# Patient Record
Sex: Male | Born: 1975 | Race: White | Hispanic: No | Marital: Married | Smoking: Never smoker
Health system: Southern US, Community
[De-identification: ages and names within clinical notes are randomized; demographics above are authoritative.]

## PROBLEM LIST (undated history)

## (undated) DIAGNOSIS — K219 Gastro-esophageal reflux disease without esophagitis: Secondary | ICD-10-CM

## (undated) DIAGNOSIS — E785 Hyperlipidemia, unspecified: Secondary | ICD-10-CM

## (undated) DIAGNOSIS — Z8661 Personal history of infections of the central nervous system: Secondary | ICD-10-CM

## (undated) HISTORY — DX: Personal history of infections of the central nervous system: Z86.61

## (undated) HISTORY — DX: Gastro-esophageal reflux disease without esophagitis: K21.9

## (undated) HISTORY — DX: Hyperlipidemia, unspecified: E78.5

---
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
## 1998-02-15 HISTORY — PX: KNEE ARTHROSCOPY W/ PARTIAL MEDIAL MENISCECTOMY: SHX1882

## 2004-02-16 HISTORY — PX: VASECTOMY: SHX75

## 2007-10-14 ENCOUNTER — Emergency Department: Payer: Self-pay | Admitting: Emergency Medicine

## 2007-10-25 ENCOUNTER — Ambulatory Visit: Payer: Self-pay | Admitting: Specialist

## 2010-08-29 IMAGING — CT CT STONE STUDY
1 of 2 series · 15 of 32 positions shown, 19 images · non-contrast
Comparison: none

REASON FOR EXAM: flank pain  rm 11
COMMENTS:   LMP: (Male)

[Series 2: soft tissue · axial · 0.64mm/px · z∈[-465,-102]mm · 15 of 138 slices shown, 19 images]
[im 11/138  soft-tissue]
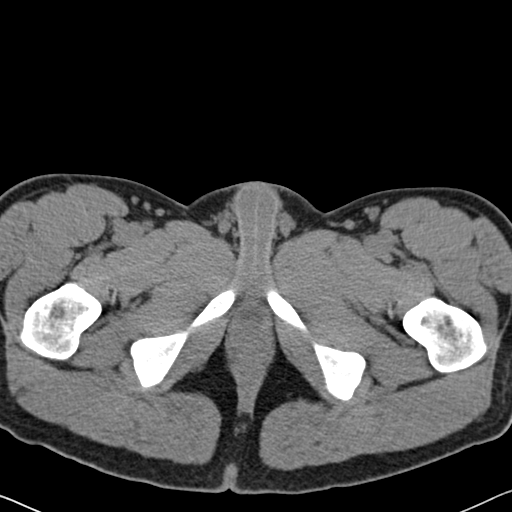
[im 11/138  bone]
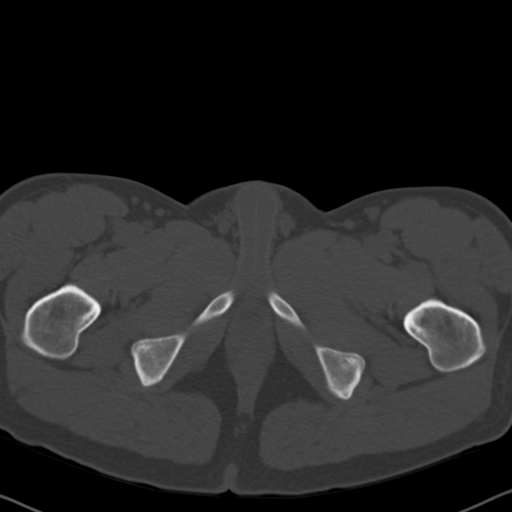
[im 21/138  soft-tissue]
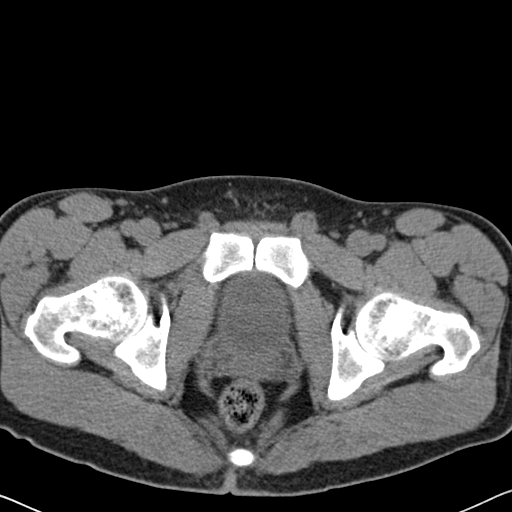
[im 31/138  soft-tissue]
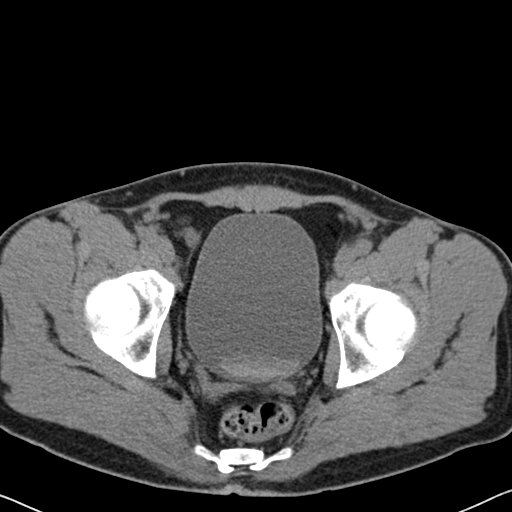
[im 41/138  soft-tissue]
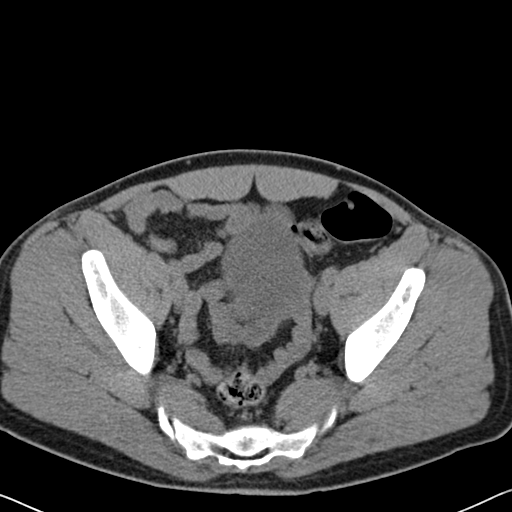
[im 51/138  soft-tissue]
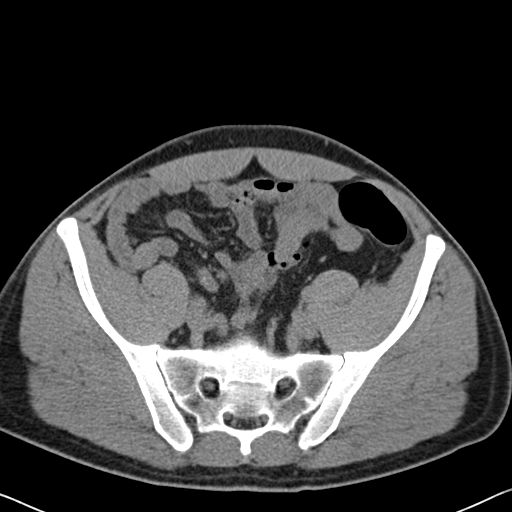
[im 61/138  soft-tissue]
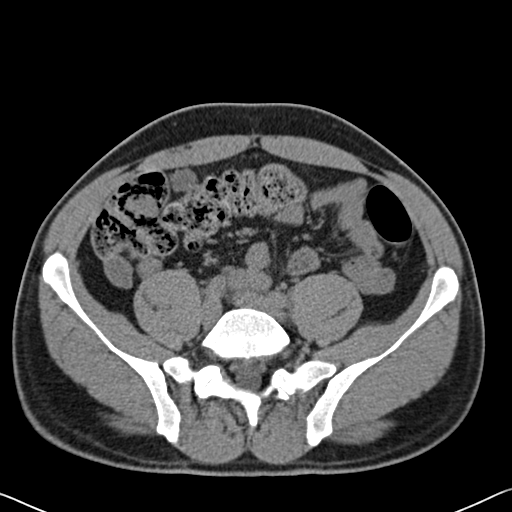
[im 72/138  soft-tissue]
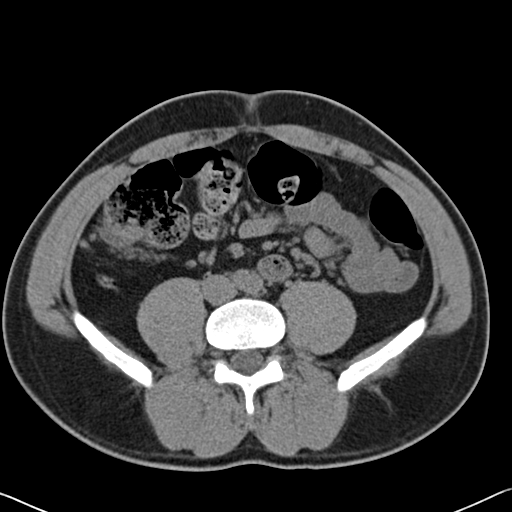
[im 82/138  soft-tissue]
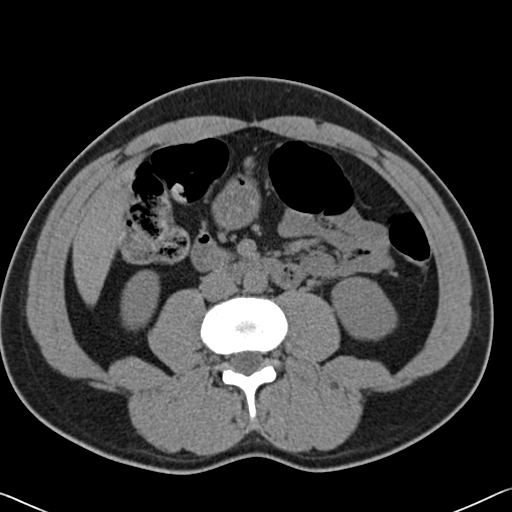
[im 92/138  soft-tissue]
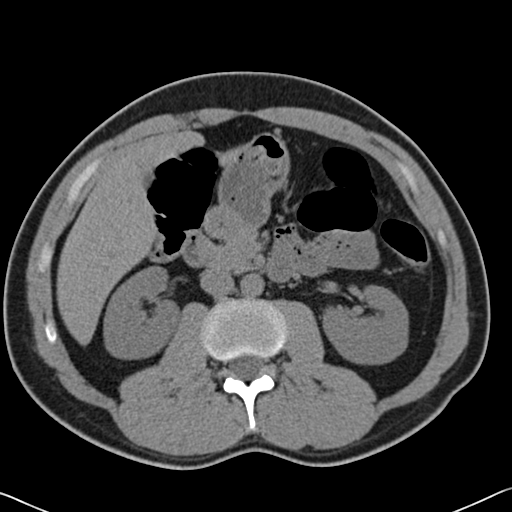
[im 92/138  bone]
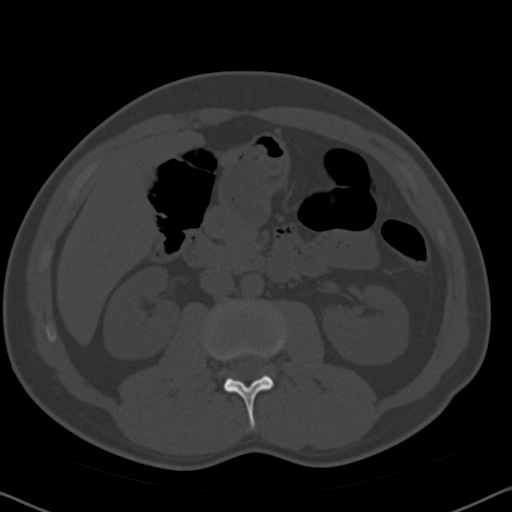
[im 102/138  soft-tissue]
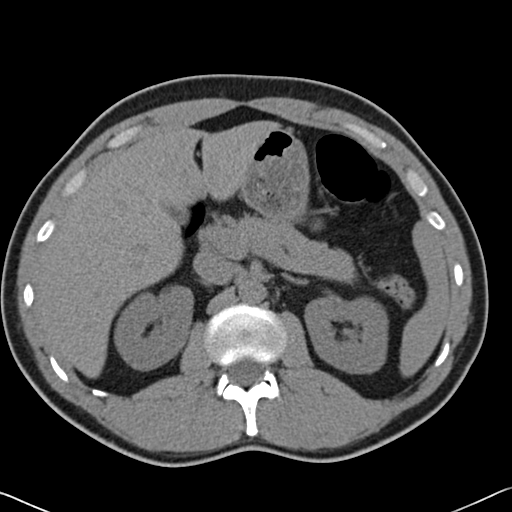
[im 112/138  soft-tissue]
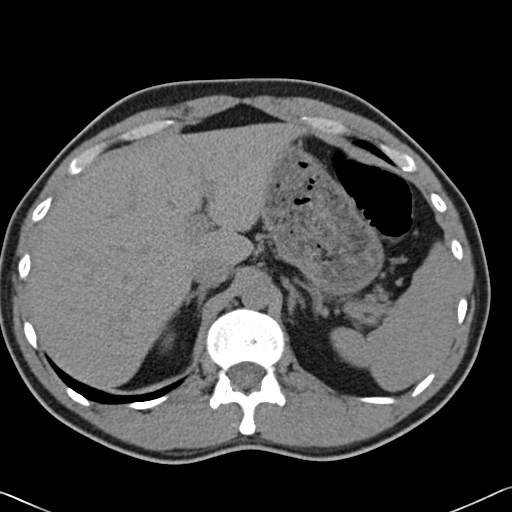
[im 117/138  lung]
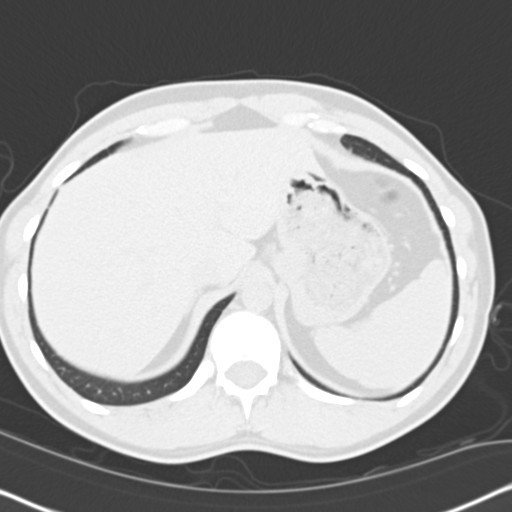
[im 122/138  soft-tissue]
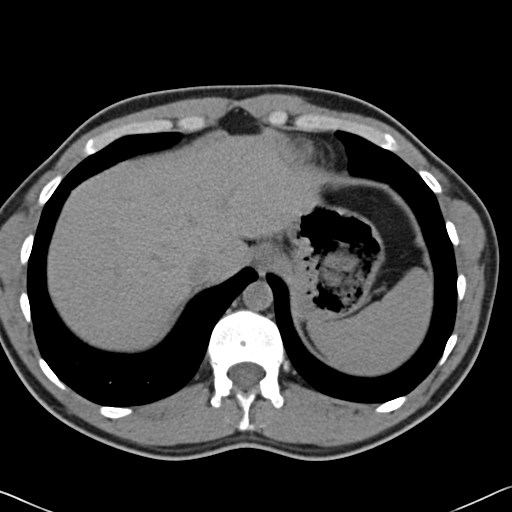
[im 122/138  lung]
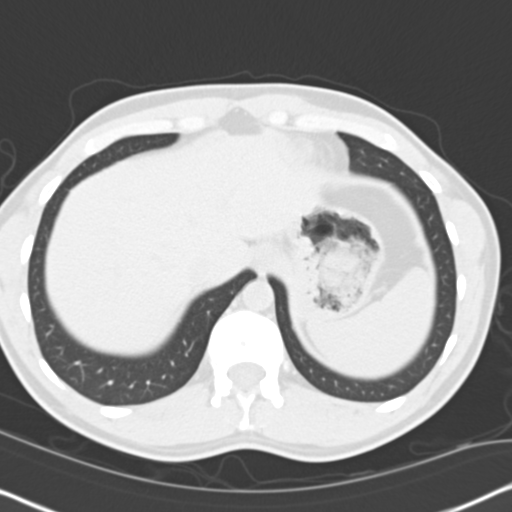
[im 127/138  lung]
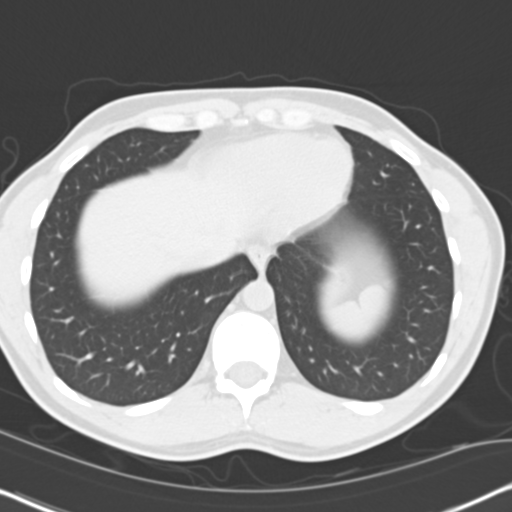
[im 132/138  soft-tissue]
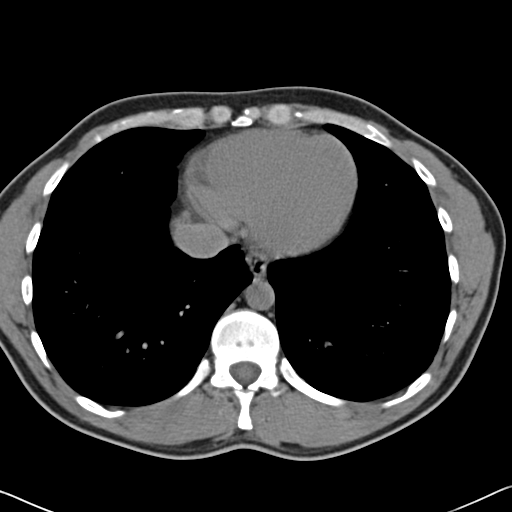
[im 132/138  lung]
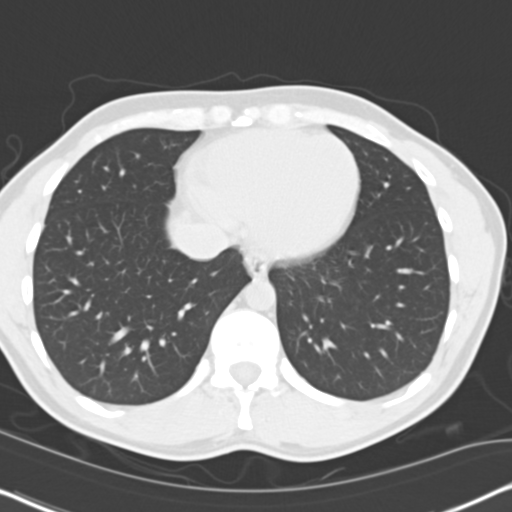

[15 of 32 positions shown; findings below may reference images not displayed]

PROCEDURE:     CT  - CT ABDOMEN /PELVIS WO (STONE)  - October 14, 2007  [DATE]

RESULT:     The study was performed without IV or oral contrast as
requested. The patient is complaining of LEFT flank discomfort. The patient
is also reporting hematuria.

The kidneys exhibit normal density and contour with no evidence of calcified
stones or obstruction or inflammatory changes. Along the expected course of
the ureters I see no abnormal calcifications. Within the urinary bladder
there is radiodense material layering posteriorly which may reflect blood
products. No bladder stone is seen. The evaluation of the bladder wall is
limited but it is grossly normal in appearance. There is no free fluid in
the pelvis. The unopacified loops of small and large bowel are normal in
appearance. The appendix is demonstrated and appears normal.

The liver, gallbladder, spleen, stomach, pancreas, and adrenal glands are
normal in appearance. The caliber of the abdominal aorta is normal.
IMPRESSION: 1. There are findings most compatible with blood products within the urinary
bladder. The etiology for this is not clear. The kidneys appear normal with
no evidence of obstruction. Further evaluation with a contrast-enhanced CT
scan may be useful. Cystoscopy may be ultimately indicated.
2. I see no abnormality elsewhere within the abdomen or pelvis.

A preliminary report was sent to the [HOSPITAL] the conclusion
of the study.

## 2010-09-09 IMAGING — CT CT ABD-PELV W/ CM
1 of 3 series · 12 of 32 positions shown, 18 images · non-contrast
Comparison: none

REASON FOR EXAM: sharp back pain hematuria
COMMENTS:

[Series 2: with · axial · 0.79mm/px · z∈[+394,+734]mm · 12 of 82 slices shown, 18 images]
[im 7/82  soft-tissue]
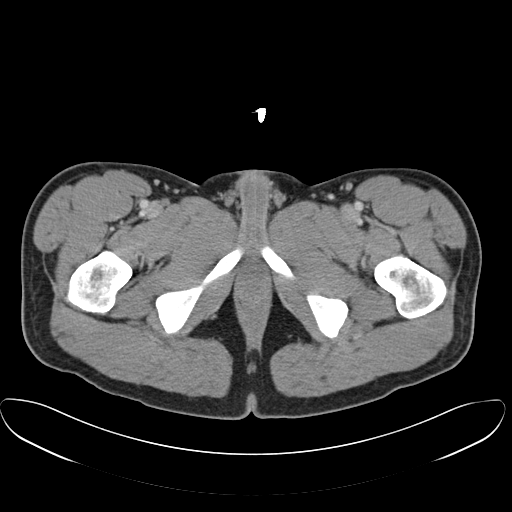
[im 7/82  bone]
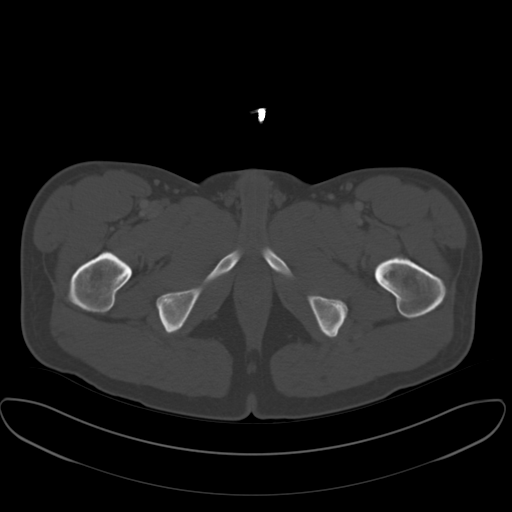
[im 13/82  soft-tissue]
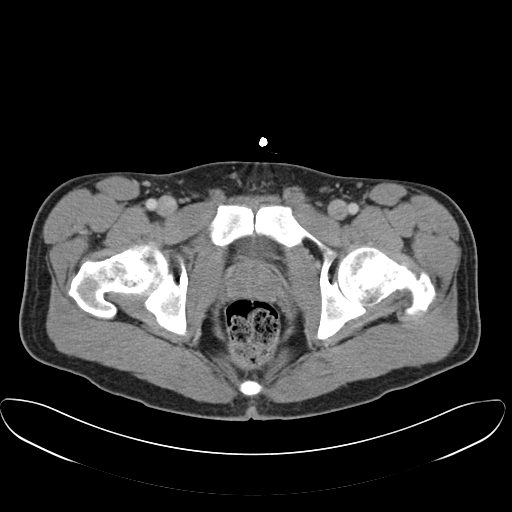
[im 19/82  soft-tissue]
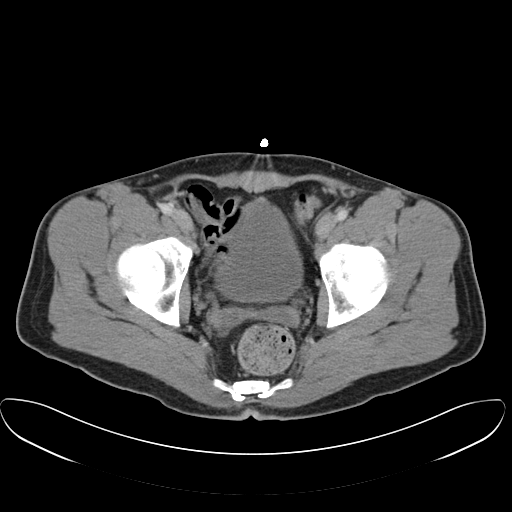
[im 25/82  soft-tissue]
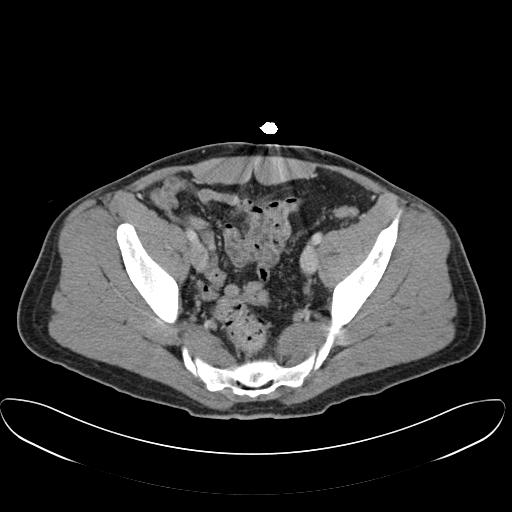
[im 32/82  soft-tissue]
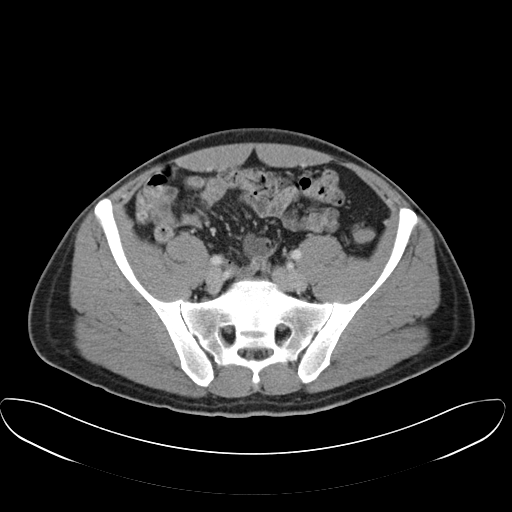
[im 38/82  soft-tissue]
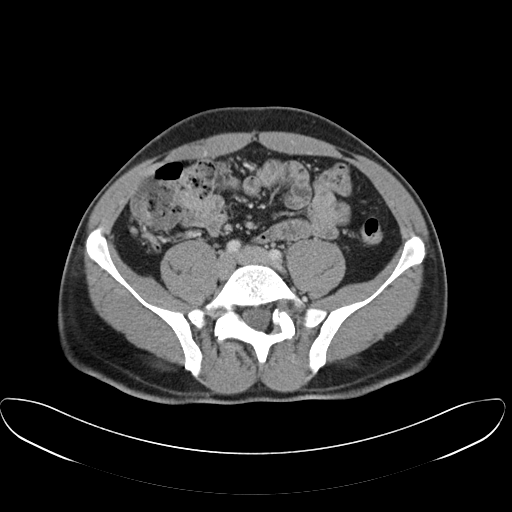
[im 44/82  soft-tissue]
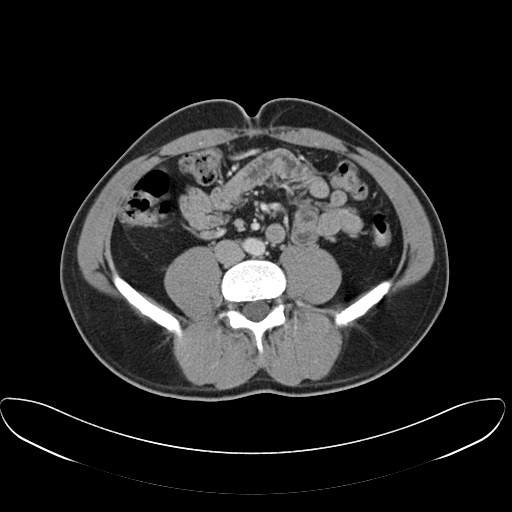
[im 50/82  soft-tissue]
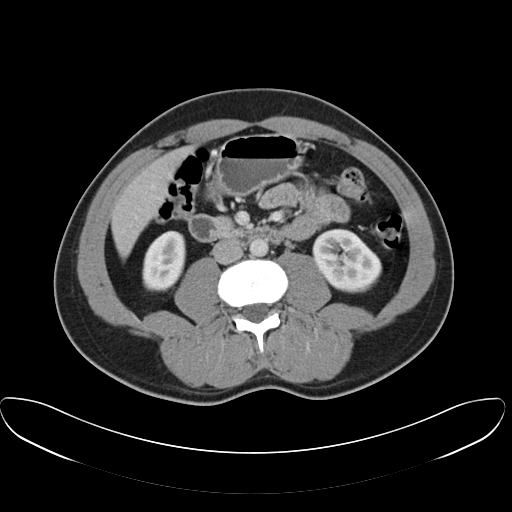
[im 57/82  soft-tissue]
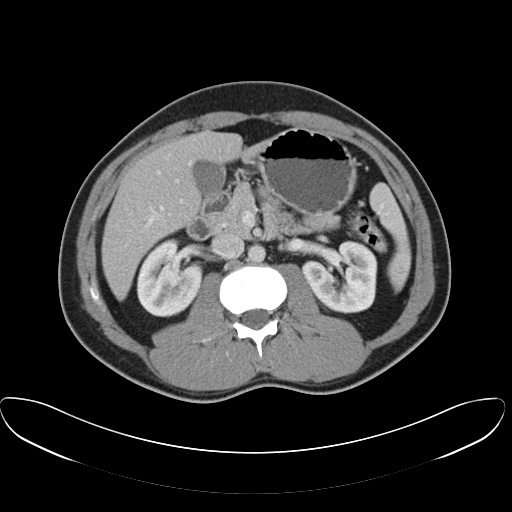
[im 57/82  lung]
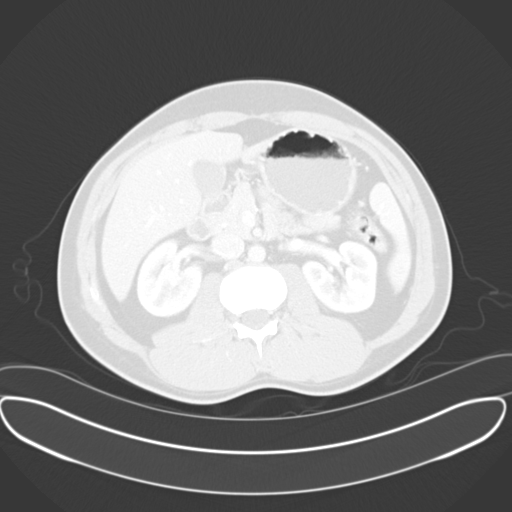
[im 57/82  bone]
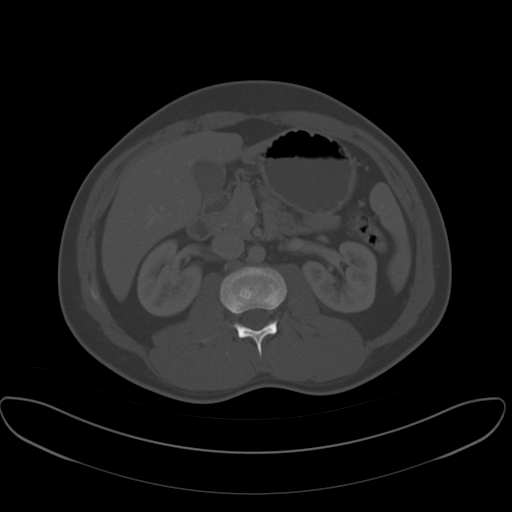
[im 63/82  soft-tissue]
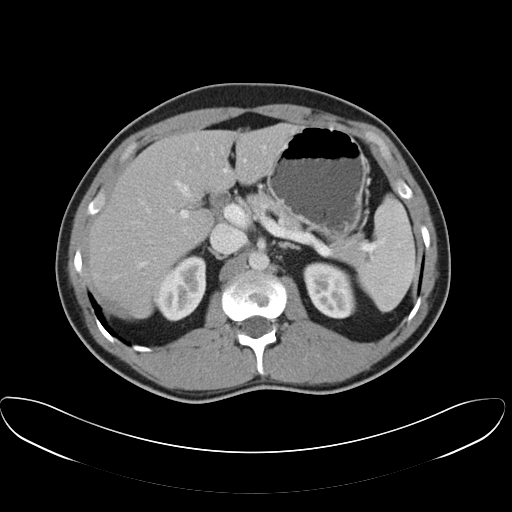
[im 63/82  lung]
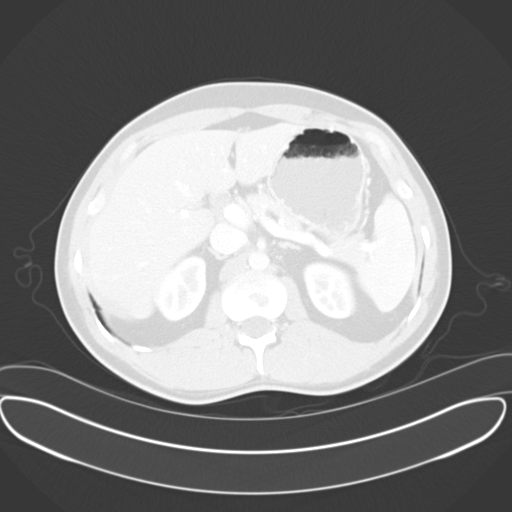
[im 69/82  soft-tissue]
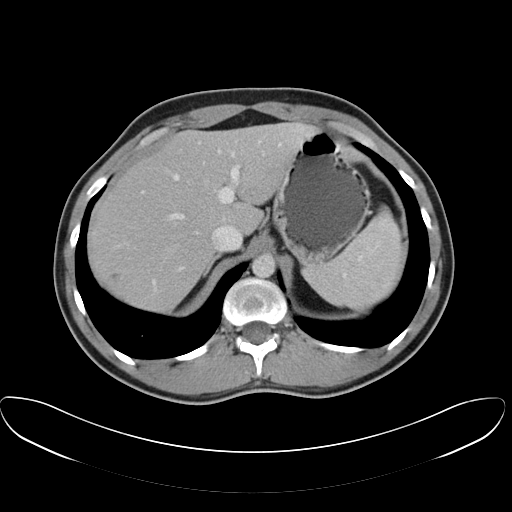
[im 69/82  lung]
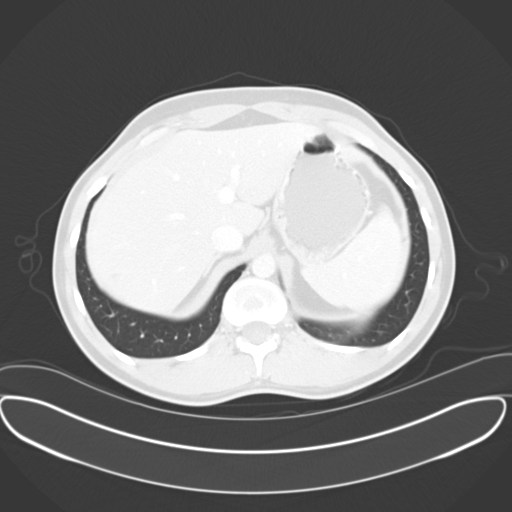
[im 75/82  soft-tissue]
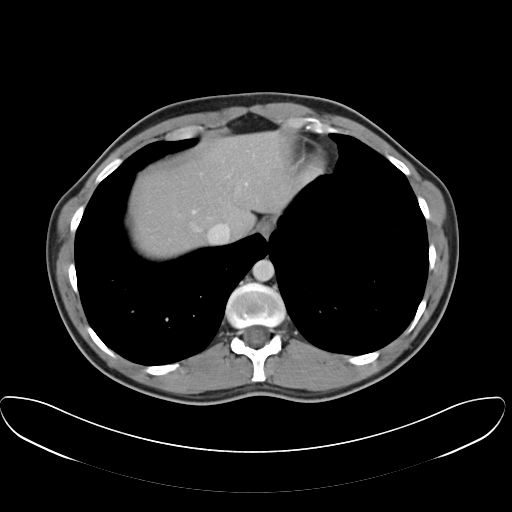
[im 75/82  lung]
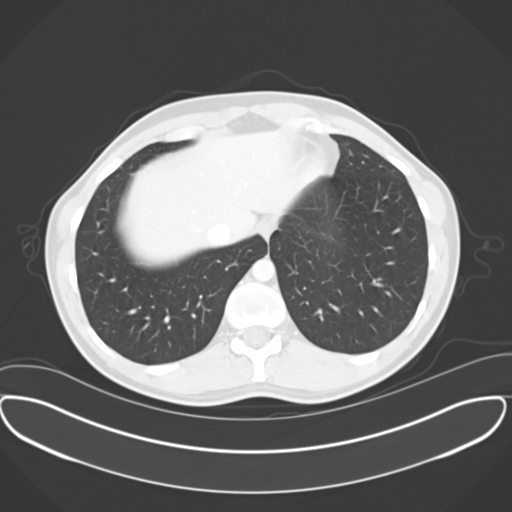

[12 of 32 positions shown; findings below may reference images not displayed]

PROCEDURE:     CT  - CT ABDOMEN / PELVIS  W  - October 25, 2007  [DATE]

RESULT:     CT of the abdomen and pelvis is performed utilizing
approximately 85 ml of [id number] iodinated intravenous contrast. No oral
contrast was administered. The patient had a previous noncontrast exam dated
10/14/2007.

The density seen in the dependent portion of the urinary bladder on the
previous exam is not evident on today's study either on the immediate
postcontrast images or on postcontrast delayed images. There is no filling
defect within the urinary bladder or opacified ureters or renal collecting
systems. No discrete renal mass is appreciated. The kidneys enhance
symmetrically.

The images demonstrate a subtle low attenuation area on the immediate
postcontrast images posteriorly in the subdiaphragmatic portion of the RIGHT
lobe of the liver which is not present on the delayed images suggesting that
this is a small hemangioma. The aorta is normal in caliber. The spleen,
pancreas, adrenal glands and appendix appear to be unremarkable. There is no
free fluid or inflammatory stranding. There is no abnormal bowel distention.

Some scattered diverticula are seen in the sigmoid region without evidence
of acute diverticulitis. Images through the base of the lungs demonstrate no
underlying infiltrate or nodule.
IMPRESSION: 1. Interval resolution of the density in the dependent portion of the
bladder from the previous noncontrast study.
2. No renal masses or urinary bladder abnormality evident.
3. Probable small hepatic hemangioma.

## 2012-03-01 ENCOUNTER — Ambulatory Visit (INDEPENDENT_AMBULATORY_CARE_PROVIDER_SITE_OTHER): Payer: PRIVATE HEALTH INSURANCE | Admitting: Family Medicine

## 2012-03-01 ENCOUNTER — Encounter: Payer: Self-pay | Admitting: Family Medicine

## 2012-03-01 VITALS — BP 120/84 | HR 70 | Temp 98.5°F | Ht 71.0 in | Wt 167.2 lb

## 2012-03-01 DIAGNOSIS — K219 Gastro-esophageal reflux disease without esophagitis: Secondary | ICD-10-CM | POA: Insufficient documentation

## 2012-03-01 DIAGNOSIS — T148XXA Other injury of unspecified body region, initial encounter: Secondary | ICD-10-CM

## 2012-03-01 DIAGNOSIS — E785 Hyperlipidemia, unspecified: Secondary | ICD-10-CM | POA: Insufficient documentation

## 2012-03-01 DIAGNOSIS — IMO0002 Reserved for concepts with insufficient information to code with codable children: Secondary | ICD-10-CM

## 2012-03-01 NOTE — Progress Notes (Signed)
   Nature conservation officer at [facility] [address] [place] [address] Phone: [phone] Fax: [phone]  Date:  03/01/2012   Name:  Jackson Simpson   DOB:  26-Jun-1975   MRN:  [id number] Gender: male Age: 37 y.o.  PCP:  Hannah Beat, MD  Evaluating MD: Hannah Beat, MD   Chief Complaint: Establish Care   History of Present Illness:  Jackson Simpson is a 37 y.o. pleasant patient who presents with the following:  Pleasant man, new patient, presents to establish care, has been seeing Elpidio Anis with regional physicians. Not currently taking meds. Had a high chol last year, but under control from diet and exercise.   Scraped his shin a couple of days ago at crossfit, now covered and using neosporin  Patient Active Problem List  Diagnosis  . GERD (gastroesophageal reflux disease)  . Hyperlipidemia    Past Medical History  Diagnosis Date  . Hyperlipidemia   . GERD (gastroesophageal reflux disease)   . H/O viral meningitis     Past Surgical History  Procedure Date  . Vasectomy 2006  . Knee arthroscopy w/ partial medial meniscectomy 2000    torn meniscus  . Testicular tortion 1994    History  Substance Use Topics  . Smoking status: Never Smoker   . Smokeless tobacco: Not on file  . Alcohol Use: 0.5 oz/week    1 drink(s) per week    No family history on file.  No Known Allergies  No current outpatient prescriptions on file prior to visit.     Review of Systems:   GEN: No acute illnesses, no fevers, chills. GI: No n/v/d, eating normally Pulm: No SOB Interactive and getting along well at home.  Otherwise, ROS is as per the HPI.   Physical Examination: BP 120/84  Pulse 70  Temp 98.5 F (36.9 C) (Oral)  Ht 5\' 11"  (1.803 m)  Wt 167 lb 4 oz (75.864 kg)  BMI 23.33 kg/m2  SpO2 96%  Ideal Body Weight: Weight in (lb) to have BMI = 25: 178.9    GEN: WDWN, NAD, Non-toxic, A & O x 3 HEENT: Atraumatic, Normocephalic. Neck supple. No masses, No  LAD. Ears and Nose: No external deformity. CV: RRR, No M/G/R. No JVD. No thrill. No extra heart sounds. PULM: CTA B, no wheezes, crackles, rhonchi. No retractions. No resp. distress. No accessory muscle use. EXTR: No c/c/e, 4 cm long scrape on anterior L shin NEURO Normal gait.  PSYCH: Normally interactive. Conversant. Not depressed or anxious appearing.  Calm demeanor.    Assessment and Plan: 1. GERD (gastroesophageal reflux disease)   2. Hyperlipidemia   3. Abrasion     Reassured about abrasion, neosporin the next few days. Cover while at work to protect clothes.  Hannah Beat, MD

## 2013-05-11 ENCOUNTER — Ambulatory Visit (INDEPENDENT_AMBULATORY_CARE_PROVIDER_SITE_OTHER): Payer: PRIVATE HEALTH INSURANCE | Admitting: Family Medicine

## 2013-05-11 ENCOUNTER — Encounter: Payer: Self-pay | Admitting: Family Medicine

## 2013-05-11 VITALS — BP 104/70 | HR 70 | Temp 98.2°F | Ht 71.0 in | Wt 165.8 lb

## 2013-05-11 DIAGNOSIS — B9789 Other viral agents as the cause of diseases classified elsewhere: Principal | ICD-10-CM

## 2013-05-11 DIAGNOSIS — J069 Acute upper respiratory infection, unspecified: Secondary | ICD-10-CM

## 2013-05-11 MED ORDER — GUAIFENESIN-CODEINE 100-10 MG/5ML PO SYRP: 5.0000 mL | ORAL_SOLUTION | Freq: Every evening | ORAL | Status: DC | PRN

## 2013-05-11 NOTE — Progress Notes (Signed)
Pre visit review using our clinic review tool, if applicable. No additional management support is needed unless otherwise documented below in the visit note. 

## 2013-05-11 NOTE — Progress Notes (Signed)
   Subjective:    Patient ID: Joneen BoersKevin Bolte, male    DOB: Aug 01, 1975, 38 y.o.   MRN: [id number]  Cough This is a new problem. The current episode started in the past 7 days. The problem has been gradually worsening. The problem occurs every few hours. The cough is productive of sputum. Associated symptoms include ear congestion and nasal congestion. Pertinent negatives include no chills, ear pain, fever, myalgias, postnasal drip, shortness of breath or wheezing. Associated symptoms comments: Chest congestion  sinus fullness Ear pain and congestion  fatigue. The symptoms are aggravated by lying down. Risk factors: nonsmoker. Treatments tried: tylenol cold, mucinex. The treatment provided moderate relief. His past medical history is significant for environmental allergies. There is no history of asthma, bronchiectasis, COPD or emphysema.      Review of Systems  Constitutional: Negative for fever and chills.  HENT: Negative for ear pain and postnasal drip.   Respiratory: Positive for cough. Negative for shortness of breath and wheezing.   Musculoskeletal: Negative for myalgias.  Allergic/Immunologic: Positive for environmental allergies.       Objective:   Physical Exam  Constitutional: Vital signs are normal. He appears well-developed and well-nourished.  Non-toxic appearance. He does not appear ill. No distress.  HENT:  Head: Normocephalic and atraumatic.  Right Ear: Hearing, tympanic membrane, external ear and ear canal normal. No tenderness. No foreign bodies. Tympanic membrane is not retracted and not bulging.  Left Ear: Hearing, tympanic membrane, external ear and ear canal normal. No tenderness. No foreign bodies. Tympanic membrane is not retracted and not bulging.  Nose: Nose normal. No mucosal edema or rhinorrhea. Right sinus exhibits no maxillary sinus tenderness and no frontal sinus tenderness. Left sinus exhibits no maxillary sinus tenderness and no frontal sinus tenderness.    Mouth/Throat: Uvula is midline, oropharynx is clear and moist and mucous membranes are normal. Normal dentition. No dental caries. No oropharyngeal exudate or tonsillar abscesses.  Eyes: Conjunctivae, EOM and lids are normal. Pupils are equal, round, and reactive to light. Lids are everted and swept, no foreign bodies found.  Neck: Trachea normal, normal range of motion and phonation normal. Neck supple. Carotid bruit is not present. No mass and no thyromegaly present.  Cardiovascular: Normal rate, regular rhythm, S1 normal, S2 normal, normal heart sounds, intact distal pulses and normal pulses.  Exam reveals no gallop.   No murmur heard. Pulmonary/Chest: Effort normal and breath sounds normal. No respiratory distress. He has no wheezes. He has no rhonchi. He has no rales.  Abdominal: Soft. Normal appearance and bowel sounds are normal. There is no hepatosplenomegaly. There is no tenderness. There is no rebound, no guarding and no CVA tenderness. No hernia.  Neurological: He is alert. He has normal reflexes.  Skin: Skin is warm, dry and intact. No rash noted.  Psychiatric: He has a normal mood and affect. His speech is normal and behavior is normal. Judgment normal.          Assessment & Plan:

## 2013-05-11 NOTE — Patient Instructions (Signed)
Mucinex DM twice daily. Start nasla saline spray 2 x daily.  Rest, fluids and time.  Call if not improving after 5-7 days.  Call sooner if shortness of breath.

## 2013-05-11 NOTE — Assessment & Plan Note (Signed)
No sing of bacterial infection. Treat symptomatically.

## 2015-04-11 ENCOUNTER — Telehealth: Payer: Self-pay | Admitting: Family Medicine

## 2015-04-11 DIAGNOSIS — E785 Hyperlipidemia, unspecified: Secondary | ICD-10-CM

## 2015-04-11 DIAGNOSIS — Z79899 Other long term (current) drug therapy: Secondary | ICD-10-CM

## 2015-04-11 NOTE — Telephone Encounter (Signed)
Can you please put cpe labs into [facility] system for pt. He will get drawn prior to cpe. Thanks

## 2015-04-12 NOTE — Telephone Encounter (Signed)
Please help with this for draw at [facility] draw station.  FLP, E78.5  Cbc with diff, HFP, BMET: Z79.899

## 2015-04-14 NOTE — Telephone Encounter (Signed)
Dr Patsy Lager, send me the orders and I will enter and send to [facility]

## 2015-04-14 NOTE — Telephone Encounter (Signed)
Entered below

## 2015-04-24 LAB — HEPATIC FUNCTION PANEL
ALT: 19 IU/L (ref 0–44)
AST: 17 IU/L (ref 0–40)
Albumin: 4.5 g/dL (ref 3.5–5.5)
Alkaline Phosphatase: 68 IU/L (ref 39–117)
Bilirubin Total: 0.4 mg/dL (ref 0.0–1.2)
Bilirubin, Direct: 0.11 mg/dL (ref 0.00–0.40)
Total Protein: 6.6 g/dL (ref 6.0–8.5)

## 2015-04-24 LAB — CBC WITH DIFFERENTIAL/PLATELET
BASOS ABS: 0 10*3/uL (ref 0.0–0.2)
Basos: 0 %
EOS (ABSOLUTE): 0.1 10*3/uL (ref 0.0–0.4)
Eos: 1 %
Hematocrit: 43.4 % (ref 37.5–51.0)
Hemoglobin: 15.2 g/dL (ref 12.6–17.7)
IMMATURE GRANS (ABS): 0 10*3/uL (ref 0.0–0.1)
Immature Granulocytes: 0 %
LYMPHS: 13 %
Lymphocytes Absolute: 1.3 10*3/uL (ref 0.7–3.1)
MCH: 30.1 pg (ref 26.6–33.0)
MCHC: 35 g/dL (ref 31.5–35.7)
MCV: 86 fL (ref 79–97)
MONOCYTES: 5 %
Monocytes Absolute: 0.5 10*3/uL (ref 0.1–0.9)
NEUTROS ABS: 7.7 10*3/uL — AB (ref 1.4–7.0)
NEUTROS PCT: 81 %
PLATELETS: 226 10*3/uL (ref 150–379)
RBC: 5.05 x10E6/uL (ref 4.14–5.80)
RDW: 13.4 % (ref 12.3–15.4)
WBC: 9.6 10*3/uL (ref 3.4–10.8)

## 2015-04-24 LAB — LIPID PANEL
Chol/HDL Ratio: 4.3 ratio units (ref 0.0–5.0)
Cholesterol, Total: 208 mg/dL — ABNORMAL HIGH (ref 100–199)
HDL: 48 mg/dL (ref >39)
LDL CALC: 132 mg/dL — AB (ref 0–99)
TRIGLYCERIDES: 141 mg/dL (ref 0–149)
VLDL Cholesterol Cal: 28 mg/dL (ref 5–40)

## 2015-04-24 LAB — BASIC METABOLIC PANEL
BUN/Creatinine Ratio: 13 (ref 9–20)
BUN: 14 mg/dL (ref 6–24)
CHLORIDE: 103 mmol/L (ref 96–106)
CO2: 21 mmol/L (ref 18–29)
Calcium: 9.3 mg/dL (ref 8.7–10.2)
Creatinine, Ser: 1.1 mg/dL (ref 0.76–1.27)
GFR calc non Af Amer: 83 mL/min/{1.73_m2} (ref >59)
GFR, EST AFRICAN AMERICAN: 96 mL/min/{1.73_m2} (ref >59)
GLUCOSE: 99 mg/dL (ref 65–99)
POTASSIUM: 4.5 mmol/L (ref 3.5–5.2)
Sodium: 140 mmol/L (ref 134–144)

## 2015-05-07 ENCOUNTER — Encounter: Payer: Self-pay | Admitting: Family Medicine

## 2015-05-07 ENCOUNTER — Ambulatory Visit (INDEPENDENT_AMBULATORY_CARE_PROVIDER_SITE_OTHER): Payer: 59 | Admitting: Family Medicine

## 2015-05-07 VITALS — BP 100/60 | HR 68 | Temp 98.4°F | Ht 70.0 in | Wt 167.5 lb

## 2015-05-07 DIAGNOSIS — Z0000 Encounter for general adult medical examination without abnormal findings: Secondary | ICD-10-CM

## 2015-05-07 DIAGNOSIS — G4700 Insomnia, unspecified: Secondary | ICD-10-CM

## 2015-05-07 DIAGNOSIS — K219 Gastro-esophageal reflux disease without esophagitis: Secondary | ICD-10-CM | POA: Diagnosis not present

## 2015-05-07 DIAGNOSIS — F411 Generalized anxiety disorder: Secondary | ICD-10-CM

## 2015-05-07 DIAGNOSIS — R61 Generalized hyperhidrosis: Secondary | ICD-10-CM

## 2015-05-07 DIAGNOSIS — IMO0001 Reserved for inherently not codable concepts without codable children: Secondary | ICD-10-CM

## 2015-05-07 MED ORDER — ESCITALOPRAM OXALATE 10 MG PO TABS: 10.0000 mg | ORAL_TABLET | Freq: Every day | ORAL | Status: DC

## 2015-05-07 NOTE — Patient Instructions (Signed)
Omeprazole 20 mg in the morning for reflux / cough.  Insomnia:  Melatonin up to 10 mg can be taken 1 hour before sleep very safely every day  Antihistamines: 2 tabs of Benadryl is OK Or can also take 2 Dramamine (get the older version that can cause drowsiness) Or Unisom (doxylamine)

## 2015-05-07 NOTE — Progress Notes (Signed)
Pre visit review using our clinic review tool, if applicable. No additional management support is needed unless otherwise documented below in the visit note. 

## 2015-05-07 NOTE — Progress Notes (Signed)
Dr. Frederico Hamman T. Marisa Hufstetler, MD, [facility] [place], [address] Phone: [phone] Fax: [phone]  05/07/2015  Patient: Jackson Simpson, MRN: [id number], DOB: 12-24-1975, 40 y.o.  Primary Physician:  Owens Loffler, MD   Chief Complaint  Patient presents with  . Annual Exam   Subjective:   Jackson Simpson is a 40 y.o. pleasant patient who presents with the following:  Preventative Health Maintenance Visit:  Health Maintenance Summary Reviewed and updated, unless pt declines services.  Tobacco History Reviewed. Alcohol: No concerns, no excessive use Exercise Habits: Some activity, rec at least 30 mins 5 times a week STD concerns: no risk or activity to increase risk Drug Use: None Encouraged self-testicular check  Tdap.  Sweaty armpits. Hyperhidrosis.   Some anxiety. May be tied together.  Worries a lot about finances.  None in family + dep in family.   Does not sleep well. Tossing and turning.   Jaw on the R sometimes painful.  Cold beverage - will cough some. Dx in the past with GERD or hiatal hernia.   Hurt his 5th PIP, ok now.    Health Maintenance  Topic Date Due  . HIV Screening  03/01/1989  . TETANUS/TDAP  03/01/1993  . INFLUENZA VACCINE  09/16/2015   Immunization History  Administered Date(s) Administered  . Influenza-Unspecified 11/16/2014   Patient Active Problem List   Diagnosis Date Noted  . GERD (gastroesophageal reflux disease)   . Hyperlipidemia    Past Medical History  Diagnosis Date  . Hyperlipidemia   . GERD (gastroesophageal reflux disease)   . H/O viral meningitis    Past Surgical History  Procedure Laterality Date  . Vasectomy  2006  . Knee arthroscopy w/ partial medial meniscectomy  2000    torn meniscus  . Testicular tortion  1994   Social History   Social History  . Marital Status: Married    Spouse Name: N/A  . Number of Children: 2  . Years of Education: 16    Occupational History  . computer    Social History Main Topics  . Smoking status: Never Smoker   . Smokeless tobacco: Never Used  . Alcohol Use: 0.5 oz/week    1 drink(s) per week  . Drug Use: No  . Sexual Activity:    Partners: Female    Patent examiner Protection: Surgical   Other Topics Concern  . 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 No Known Allergies  Medication list has been reviewed and updated.   General: Denies fever, chills, sweats. No significant weight loss. Eyes: Denies blurring,significant itching ENT: Denies earache, sore throat, and hoarseness. Cardiovascular: Denies chest pains, palpitations, dyspnea on exertion Respiratory: Denies cough, dyspnea at rest,wheeezing Breast: no concerns about lumps GI: Denies nausea, vomiting, diarrhea, constipation, change in bowel habits, abdominal pain, melena, hematochezia GU: Denies penile discharge, ED, urinary flow / outflow problems. No STD concerns. Musculoskeletal: Denies back pain, joint pain Derm: Denies rash, itching Neuro: Denies  paresthesias, frequent falls, frequent headaches Psych: anxiety Endocrine: Denies cold intolerance, heat intolerance, polydipsia Heme: Denies enlarged lymph nodes Allergy: No hayfever  Objective:   BP 100/60 mmHg  Pulse 68  Temp(Src) 98.4 F (36.9 C) (Oral)  Ht 5' 10"  (1.778 m)  Wt 167 lb 8  oz (75.978 kg)  BMI 24.03 kg/m2 Ideal Body Weight: Weight in (lb) to have BMI = 25: 173.9  No exam data present  GEN: well developed, well nourished, no acute distress Eyes: conjunctiva and lids normal, PERRLA, EOMI ENT: TM clear, nares clear, oral exam WNL Neck: supple, no lymphadenopathy, no thyromegaly, no JVD Pulm: clear to auscultation and percussion, respiratory effort normal CV: regular rate and rhythm, S1-S2, no murmur, rub or  gallop, no bruits, peripheral pulses normal and symmetric, no cyanosis, clubbing, edema or varicosities GI: soft, non-tender; no hepatosplenomegaly, masses; active bowel sounds all quadrants GU: no hernia, testicular mass, penile discharge Lymph: no cervical, axillary or inguinal adenopathy MSK: gait normal, muscle tone and strength WNL, no joint swelling, effusions, discoloration, crepitus  SKIN: clear, good turgor, color WNL, no rashes, lesions, or ulcerations Neuro: normal mental status, normal strength, sensation, and motion Psych: alert; oriented to person, place and time, normally interactive and not anxious or depressed in appearance. All labs reviewed with patient.  Lipids:    Component Value Date/Time   CHOL 208* 04/23/2015 0757   TRIG 141 04/23/2015 0757   HDL 48 04/23/2015 0757   CHOLHDL 4.3 04/23/2015 0757   CBC: CBC Latest Ref Rng 04/23/2015  WBC 3.4 - 10.8 x10E3/uL 9.6  Hematocrit 37.5 - 51.0 % 43.4  Platelets 150 - 379 x10E3/uL 646    Basic Metabolic Panel:    Component Value Date/Time   NA 140 04/23/2015 0757   K 4.5 04/23/2015 0757   CL 103 04/23/2015 0757   CO2 21 04/23/2015 0757   BUN 14 04/23/2015 0757   CREATININE 1.10 04/23/2015 0757   GLUCOSE 99 04/23/2015 0757   CALCIUM 9.3 04/23/2015 0757   Hepatic Function Latest Ref Rng 04/23/2015  Total Protein 6.0 - 8.5 g/dL 6.6  Albumin 3.5 - 5.5 g/dL 4.5  AST 0 - 40 IU/L 17  ALT 0 - 44 IU/L 19  Alk Phosphatase 39 - 117 IU/L 68  Total Bilirubin 0.0 - 1.2 mg/dL 0.4  Bilirubin, Direct 0.00 - 0.40 mg/dL 0.11    No results found for: TSH No results found for: PSA  Assessment and Plan:   Healthcare maintenance  Generalized anxiety disorder  Sweating  Gastroesophageal reflux disease, esophagitis presence not specified  Insomnia  >15 minutes spent in face to face time with patient, >50% spent in counselling or coordination of care: new-onset generalized anxiety without depression.  May have some  intermittent mild panic attacks as well.  He also has been having some intermittent insomnia.  We are going to try him on some Lexapro at 10 mg a day with close follow-up.  Follow-up in 6 weeks.  He also is having some sweating, these may be associated with his anxiety and panic attacks, so for now we're going to hold and see how he does with these when we treat his anxiety.  Anxiety has become such that it interferes with both his work and his daily life.  Health Maintenance Exam: The patient's preventative maintenance and recommended screening tests for an annual wellness exam were reviewed in full today. Brought up to date unless services declined.  Counselled on the importance of diet, exercise, and its role in overall health and mortality. The patient's FH and SH was reviewed, including their home life, tobacco status, and drug and alcohol status.  Follow-up: No Follow-up on file. Unless noted, follow-up in 1 year for Health Maintenance Exam.  New Prescriptions   ESCITALOPRAM (LEXAPRO) 10 MG TABLET  Take 1 tablet (10 mg total) by mouth daily.   Patient Instructions  Omeprazole 20 mg in the morning for reflux / cough.  Insomnia:  Melatonin up to 10 mg can be taken 1 hour before sleep very safely every day  Antihistamines: 2 tabs of Benadryl is OK Or can also take 2 Dramamine (get the older version that can cause drowsiness) Or Unisom (doxylamine)      Signed,  Frederico Hamman T. Zyeir Dymek, MD   Patient's Medications  New Prescriptions   ESCITALOPRAM (LEXAPRO) 10 MG TABLET    Take 1 tablet (10 mg total) by mouth daily.  Previous Medications   No medications on file  Modified Medications   No medications on file  Discontinued Medications   GUAIFENESIN-CODEINE (ROBITUSSIN AC) 100-10 MG/5ML SYRUP    Take 5-10 mLs by mouth at bedtime as needed for cough.

## 2015-06-18 ENCOUNTER — Ambulatory Visit (INDEPENDENT_AMBULATORY_CARE_PROVIDER_SITE_OTHER): Payer: 59 | Admitting: Family Medicine

## 2015-06-18 ENCOUNTER — Encounter: Payer: Self-pay | Admitting: Family Medicine

## 2015-06-18 VITALS — BP 104/68 | HR 57 | Temp 98.3°F | Ht 70.0 in | Wt 167.8 lb

## 2015-06-18 DIAGNOSIS — R61 Generalized hyperhidrosis: Secondary | ICD-10-CM

## 2015-06-18 DIAGNOSIS — F411 Generalized anxiety disorder: Secondary | ICD-10-CM | POA: Diagnosis not present

## 2015-06-18 DIAGNOSIS — IMO0001 Reserved for inherently not codable concepts without codable children: Secondary | ICD-10-CM

## 2015-06-18 MED ORDER — ESCITALOPRAM OXALATE 10 MG PO TABS: 10.0000 mg | ORAL_TABLET | Freq: Every day | ORAL | Status: DC

## 2015-06-18 NOTE — Progress Notes (Signed)
Dr. Karleen Hampshire T. Nicole Hafley, MD, [facility] [address] [place], [address] Phone: [phone] Fax: [phone]  06/18/2015  Patient: Jackson Simpson, MRN: [id number], DOB: 06/21/1975, 40 y.o.  Primary Physician:  Hannah Beat, MD   Chief Complaint  Patient presents with  . Follow-up   Subjective:   Jackson Simpson is a 40 y.o. very pleasant male patient who presents with the following:  Lexapro and melatonin at night. Anxiety doing much better.   Taking 12 mg at night.   Dep in father.    Past Medical History, Surgical History, Social History, Family History, Problem List, Medications, and Allergies have been reviewed and updated if relevant.  Patient Active Problem List   Diagnosis Date Noted  . GERD (gastroesophageal reflux disease)   . Hyperlipidemia     Past Medical History  Diagnosis Date  . Hyperlipidemia   . GERD (gastroesophageal reflux disease)   . H/O viral meningitis     Past Surgical History  Procedure Laterality Date  . Vasectomy  2006  . Knee arthroscopy w/ partial medial meniscectomy  2000    torn meniscus  . Testicular tortion  1994    Social History   Social History  . Marital Status: Married    Spouse Name: N/A  . Number of Children: 2  . Years of Education: 16   Occupational History  . computer    Social History Main Topics  . Smoking status: Never Smoker   . Smokeless tobacco: Never Used  . Alcohol Use: 0.5 oz/week    1 drink(s) per week  . Drug Use: No  . Sexual Activity:    Partners: Female    Pharmacist, hospital Protection: Surgical   Other Topics Concern  . Not on file   Social History Narrative   Married, 2 children (8 and 3 yo 02/2012)   Works in Arts administrator for healthcare   Active and works out in Nurse, mental health    No family history on file.  No Known Allergies  Medication list reviewed and updated in full in [facility].   GEN: No acute illnesses, no fevers,  chills. GI: No n/v/d, eating normally Pulm: No SOB Interactive and getting along well at home.  Otherwise, ROS is as per the HPI.  Objective:   BP 104/68 mmHg  Pulse 57  Temp(Src) 98.3 F (36.8 C) (Oral)  Ht  (1.778 m)  Wt 167 lb 12 oz (76.091 kg)  BMI 24.07 kg/m2  GEN: WDWN, NAD, Non-toxic, A & O x 3 HEENT: Atraumatic, Normocephalic. Neck supple. No masses, No LAD. Ears and Nose: No external deformity. EXTR: No c/c/e NEURO Normal gait.  PSYCH: Normally interactive. Conversant. Not depressed or anxious appearing.  Calm demeanor.   Laboratory and Imaging Data:  Assessment and Plan:   Generalized anxiety disorder  Sweating  >15 minutes spent in face to face time with patient, >50% spent in counselling or coordination of care: he is doing really quite a bit better on his Lexapro 10 mg.  Both he and his wife have noticed a big difference.  He is not having anxiety and panic attacks anymore, and she has noticed quite a bit of difference in his overall demeanor and irritability.  He is feeling better at work, and he is not having any of the profuse sweating that he was having at various times.  Overall, much improved, and we will see him back in one year for routine healthcare.  Follow-up: No Follow-up on file.  New Prescriptions   No medications on file   Modified Medications   Modified Medication Previous Medication   ESCITALOPRAM (LEXAPRO) 10 MG TABLET escitalopram (LEXAPRO) 10 MG tablet      Take 1 tablet (10 mg total) by mouth daily.    Take 1 tablet (10 mg total) by mouth daily.   No orders of the defined types were placed in this encounter.    Signed,  Elpidio GaleaSpencer T. Sheika Coutts, MD   Patient's Medications  New Prescriptions   No medications on file  Previous Medications   No medications on file  Modified Medications   Modified Medication Previous Medication   ESCITALOPRAM (LEXAPRO) 10 MG TABLET escitalopram (LEXAPRO) 10 MG tablet      Take 1 tablet (10 mg  total) by mouth daily.    Take 1 tablet (10 mg total) by mouth daily.  Discontinued Medications   No medications on file

## 2015-06-18 NOTE — Progress Notes (Signed)
Pre visit review using our clinic review tool, if applicable. No additional management support is needed unless otherwise documented below in the visit note. 

## 2015-09-03 ENCOUNTER — Encounter: Payer: Self-pay | Admitting: Family Medicine

## 2015-12-24 ENCOUNTER — Encounter: Payer: Self-pay | Admitting: Family Medicine

## 2016-03-21 ENCOUNTER — Encounter: Payer: Self-pay | Admitting: Family Medicine

## 2016-03-23 NOTE — Telephone Encounter (Signed)
I am happy to do this and do it when people move.   Could you find out exactly what he wants - like a 90 day paper script or something sent to a mail-order pharmacy or what.  I am fine with giving him 90, 0 ref

## 2016-03-26 MED ORDER — ESCITALOPRAM OXALATE 10 MG PO TABS: 10.0000 mg | ORAL_TABLET | Freq: Every day | ORAL | 0 refills | Status: DC

## 2016-03-26 NOTE — Telephone Encounter (Signed)
Rx printed and placed in Dr. Cyndie Chimeopland's in box for signature.  Mr. Jackson Simpson will pick up the Rx on 04/07/16 when he is in town.

## 2016-08-06 ENCOUNTER — Other Ambulatory Visit: Payer: Self-pay | Admitting: Family Medicine
